# Patient Record
Sex: Male | Born: 1965 | Race: White | Hispanic: No | Marital: Single | Smoking: Current every day smoker
Health system: Southern US, Community
[De-identification: ages and names within clinical notes are randomized; demographics above are authoritative.]

---

## 2000-02-29 ENCOUNTER — Other Ambulatory Visit: Admission: RE | Admit: 2000-02-29 | Discharge: 2000-02-29 | Payer: Self-pay | Admitting: Orthopedic Surgery

## 2013-11-29 ENCOUNTER — Other Ambulatory Visit: Payer: Self-pay | Admitting: Internal Medicine

## 2013-11-29 ENCOUNTER — Ambulatory Visit
Admission: RE | Admit: 2013-11-29 | Discharge: 2013-11-29 | Disposition: A | Discharge status: Home / Self Care | Payer: BC Managed Care – PPO | Source: Ambulatory Visit | Attending: Internal Medicine | Admitting: Internal Medicine

## 2013-11-29 ENCOUNTER — Encounter (INDEPENDENT_AMBULATORY_CARE_PROVIDER_SITE_OTHER): Payer: Self-pay

## 2013-11-29 DIAGNOSIS — R059 Cough, unspecified: Secondary | ICD-10-CM

## 2013-11-29 DIAGNOSIS — R05 Cough: Secondary | ICD-10-CM

## 2014-04-14 ENCOUNTER — Encounter (HOSPITAL_COMMUNITY): Payer: Self-pay | Admitting: Emergency Medicine

## 2014-04-14 DIAGNOSIS — R079 Chest pain, unspecified: Secondary | ICD-10-CM | POA: Diagnosis present

## 2014-04-14 DIAGNOSIS — R51 Headache: Secondary | ICD-10-CM | POA: Insufficient documentation

## 2014-04-14 DIAGNOSIS — R0789 Other chest pain: Secondary | ICD-10-CM | POA: Diagnosis not present

## 2014-04-14 DIAGNOSIS — F17200 Nicotine dependence, unspecified, uncomplicated: Secondary | ICD-10-CM | POA: Diagnosis not present

## 2014-04-14 LAB — CBC
HEMATOCRIT: 41 % (ref 39.0–52.0)
HEMOGLOBIN: 13.9 g/dL (ref 13.0–17.0)
MCH: 30.2 pg (ref 26.0–34.0)
MCHC: 33.9 g/dL (ref 30.0–36.0)
MCV: 89.1 fL (ref 78.0–100.0)
Platelets: 304 10*3/uL (ref 150–400)
RBC: 4.6 MIL/uL (ref 4.22–5.81)
RDW: 13.8 % (ref 11.5–15.5)
WBC: 12.9 10*3/uL — ABNORMAL HIGH (ref 4.0–10.5)

## 2014-04-14 LAB — I-STAT TROPONIN, ED: Troponin i, poc: 0 ng/mL (ref 0.00–0.08)

## 2014-04-14 MED ORDER — OXYCODONE-ACETAMINOPHEN 5-325 MG PO TABS
1.0000 | ORAL_TABLET | Freq: Once | ORAL | Status: AC
  Administered 2014-04-14: 1 via ORAL
  Filled 2014-04-14: qty 1

## 2014-04-14 NOTE — ED Notes (Signed)
Pt reports headache and chest tightnesst that started 45 mins ago. Also stated he felt his fingers tingling earlier but they are not now.

## 2014-04-15 ENCOUNTER — Emergency Department (HOSPITAL_COMMUNITY): Payer: BC Managed Care – PPO

## 2014-04-15 ENCOUNTER — Emergency Department (HOSPITAL_COMMUNITY)
Admission: EM | Admit: 2014-04-15 | Discharge: 2014-04-15 | Disposition: A | Discharge status: Home / Self Care | Payer: BC Managed Care – PPO | Attending: Emergency Medicine | Admitting: Emergency Medicine

## 2014-04-15 DIAGNOSIS — R0789 Other chest pain: Secondary | ICD-10-CM

## 2014-04-15 LAB — I-STAT TROPONIN, ED: Troponin i, poc: 0 ng/mL (ref 0.00–0.08)

## 2014-04-15 LAB — BASIC METABOLIC PANEL
Anion gap: 11 (ref 5–15)
BUN: 9 mg/dL (ref 6–23)
CALCIUM: 9.7 mg/dL (ref 8.4–10.5)
CO2: 27 mEq/L (ref 19–32)
CREATININE: 0.92 mg/dL (ref 0.50–1.35)
Chloride: 104 mEq/L (ref 96–112)
GLUCOSE: 96 mg/dL (ref 70–99)
Potassium: 4.4 mEq/L (ref 3.7–5.3)
Sodium: 142 mEq/L (ref 137–147)

## 2014-04-15 MED ORDER — SODIUM CHLORIDE 0.9 % IV BOLUS (SEPSIS)
1000.0000 mL | Freq: Once | INTRAVENOUS | Status: AC
  Administered 2014-04-15: 1000 mL via INTRAVENOUS

## 2014-04-15 MED ORDER — METOCLOPRAMIDE HCL 5 MG/ML IJ SOLN
10.0000 mg | Freq: Once | INTRAMUSCULAR | Status: AC
  Administered 2014-04-15: 10 mg via INTRAVENOUS
  Filled 2014-04-15: qty 2

## 2014-04-15 MED ORDER — DIPHENHYDRAMINE HCL 50 MG/ML IJ SOLN
25.0000 mg | Freq: Once | INTRAMUSCULAR | Status: AC
  Administered 2014-04-15: 25 mg via INTRAVENOUS
  Filled 2014-04-15: qty 1

## 2014-04-15 MED ORDER — ASPIRIN 325 MG PO TABS
325.0000 mg | ORAL_TABLET | Freq: Once | ORAL | Status: AC
  Administered 2014-04-15: 325 mg via ORAL
  Filled 2014-04-15: qty 1

## 2014-04-15 NOTE — ED Notes (Addendum)
EKG at bedside with patient. Signed by a doctor. Ineligible signature.

## 2014-04-15 NOTE — Discharge Instructions (Signed)
Follow up with your doctor. You may need stress test if you still have chest pain.   Return to ER if you have worse chest pain, shortness of breath.

## 2014-04-15 NOTE — ED Provider Notes (Signed)
CSN: [id number]     Arrival date & time 04/14/14  2302 History   First MD Initiated Contact with Patient 04/15/14 0101     Chief Complaint  Patient presents with  . Chest Pain     (Consider location/radiation/quality/duration/timing/severity/associated sxs/prior Treatment) The history is provided by the patient.  Aaron Ramirez is a 48 y.o. male here with headache, chest pain. He was stressed out at work today. Then had chest tightness that lasted 45 min. Associated with L arm numbness and tingling and pain between shoulder blades. Also had some diaphoresis as well. Afterwards, he felt light headed and dizzy and had gradual onset headache. No hx of CAD but father had CAD age 65s. He is also a smoker.    History reviewed. No pertinent past medical history. History reviewed. No pertinent past surgical history. No family history on file. History  Substance Use Topics  . Smoking status: Current Every Day Smoker  . Smokeless tobacco: Not on file  . Alcohol Use: No    Review of Systems  Cardiovascular: Positive for chest pain.  All other systems reviewed and are negative.     Allergies  Morphine and related  Home Medications   Prior to Admission medications   Not on File   BP 114/72  Pulse 60  Temp(Src) 98 F (36.7 C) (Oral)  Resp 13  Ht  (1.905 m)  Wt 190 lb (86.183 kg)  BMI 23.75 kg/m2  SpO2 99% Physical Exam  Nursing note and vitals reviewed. Constitutional: He is oriented to person, place, and time.  Uncomfortable, mild photophobia   HENT:  Head: Normocephalic.  Eyes: Conjunctivae and EOM are normal. Pupils are equal, round, and reactive to light.  Neck: Normal range of motion. Neck supple.  No meingeal signs   Cardiovascular: Normal rate, regular rhythm and normal heart sounds.   Pulmonary/Chest: Effort normal. No respiratory distress. He has no wheezes. He has no rales.  Abdominal: Soft. Bowel sounds are normal. He exhibits no distension. There is no  tenderness. There is no rebound.  Musculoskeletal: Normal range of motion. He exhibits no edema and no tenderness.  Neurological: He is alert and oriented to person, place, and time.  Skin: Skin is warm and dry.  Psychiatric: He has a normal mood and affect. His behavior is normal. Judgment and thought content normal.    ED Course  Procedures (including critical care time) Labs Review Labs Reviewed  CBC - Abnormal; Notable for the following:    WBC 12.9 (*)    All other components within normal limits  BASIC METABOLIC PANEL  I-STAT TROPOININ, ED  Rosezena Sensor, ED    Imaging Review Dg Chest 2 View  04/15/2014   CLINICAL DATA:  Chest pain, headache.  EXAM: CHEST  2 VIEW  COMPARISON:  Chest radiograph Nov 29, 2013  FINDINGS: The heart size and mediastinal contours are within normal limits. Both lungs are clear. The visualized skeletal structures are unremarkable.  IMPRESSION: No active cardiopulmonary disease.   Electronically Signed   By: Awilda Metro   On: 04/15/2014 00:30     EKG Interpretation None      Date: 04/15/2014  Rate: 71  Rhythm: normal sinus rhythm  QRS Axis: normal  Intervals: normal  ST/T Wave abnormalities: normal  Conduction Disutrbances:none  Narrative Interpretation:   Old EKG Reviewed: none available    MDM   Final diagnoses:  None    Aaron Ramirez is a 48 y.o. male here with chest pain.  Has some risk factors for ACS. Will get trop x 2. I doubt dissection or PE. Will give migraine cocktail for headache. Neuro exam unremarkable so I doubt stroke or subarachnoid.   3:16 AM Delta trop neg. Headache improved with migraine cocktail. Will d/c home.      Richardean Canal, MD 04/15/14 [id number]

## 2016-04-28 IMAGING — CR DG CHEST 2V
2 series · 2 of 2 positions shown · non-contrast
Comparison: 03/15/2011 and prior chest radiographs dating back to
08/08/2005

CLINICAL DATA: 48-year-old male with cough.

EXAM:
CHEST  2 VIEW

[w chest pa]
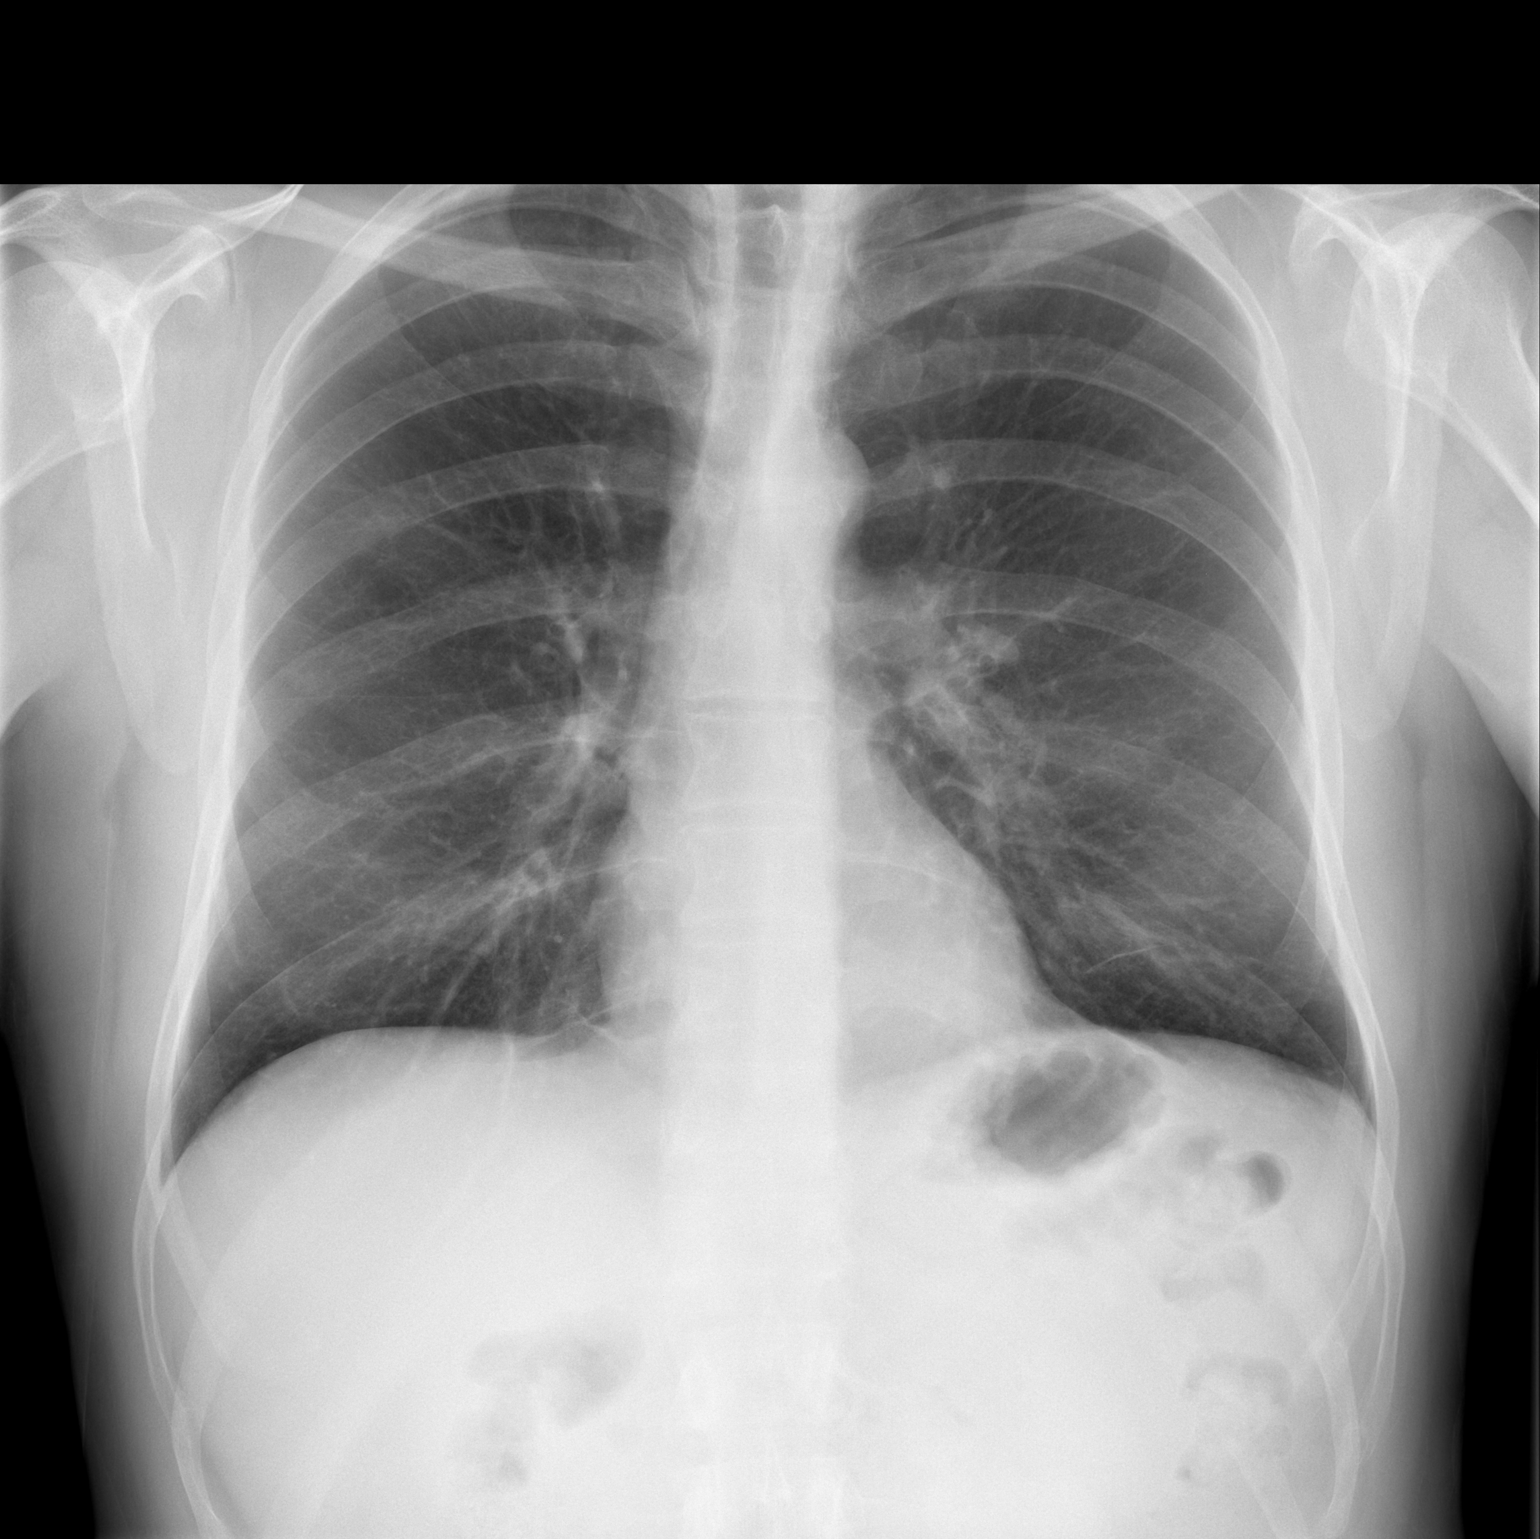

[w chest lat]
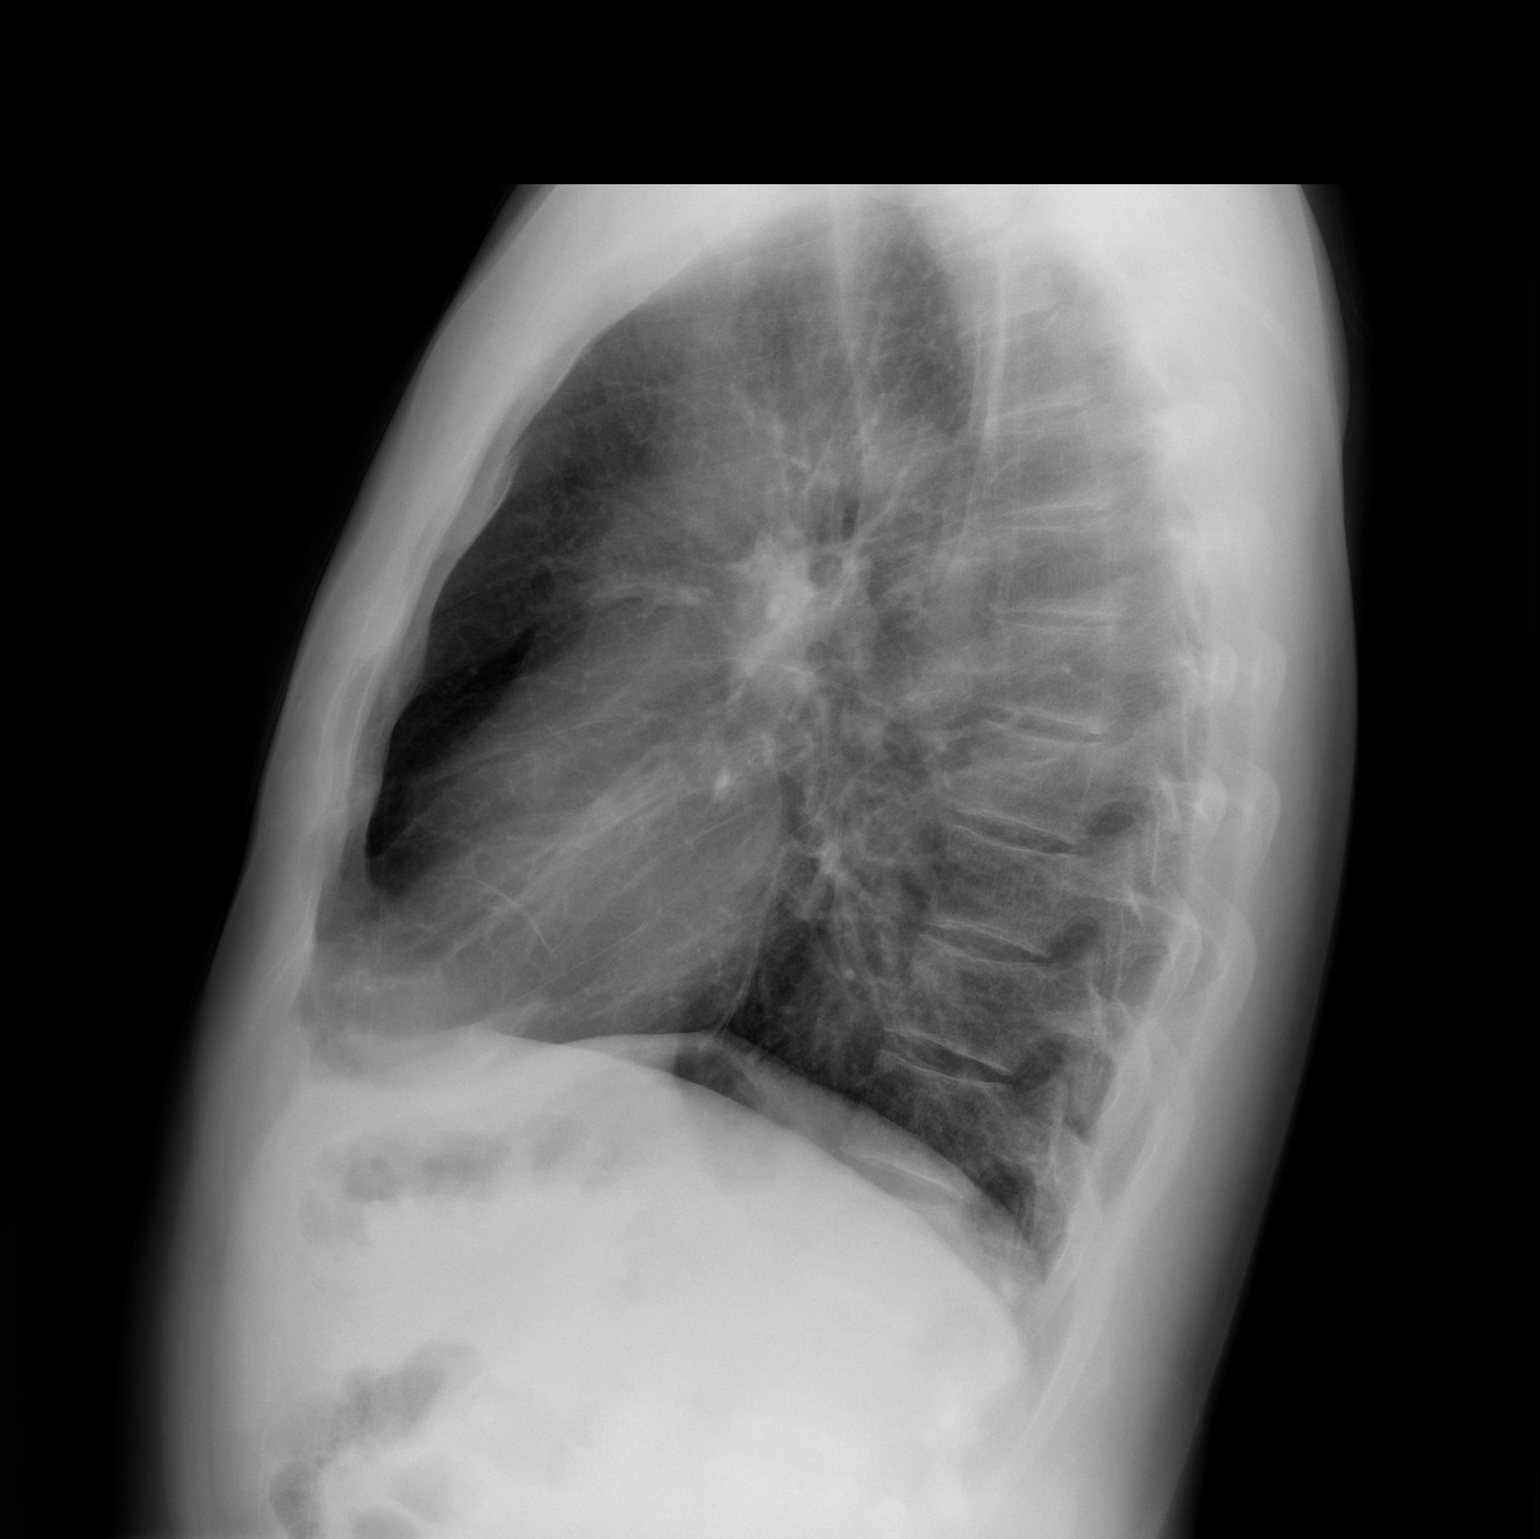

[2 of 2 positions shown; findings below may reference images not displayed]

FINDINGS: The cardiomediastinal silhouette is unremarkable.

There is no evidence of focal airspace disease, pulmonary edema,
suspicious pulmonary nodule/mass, pleural effusion, or pneumothorax.
No acute bony abnormalities are identified.
IMPRESSION: No active cardiopulmonary disease.

## 2020-02-12 ENCOUNTER — Encounter: Payer: Self-pay | Admitting: General Practice

## 2022-06-06 DIAGNOSIS — S82841A Displaced bimalleolar fracture of right lower leg, initial encounter for closed fracture: Secondary | ICD-10-CM

## 2022-11-14 DIAGNOSIS — R079 Chest pain, unspecified: Secondary | ICD-10-CM

## 2022-11-15 DIAGNOSIS — R079 Chest pain, unspecified: Secondary | ICD-10-CM | POA: Diagnosis not present
